# Patient Record
Sex: Female | Born: 1970 | Race: White | Hispanic: No | Marital: Married | Smoking: Never smoker
Health system: Southern US, Community
[De-identification: ages and names within clinical notes are randomized; demographics above are authoritative.]

## PROBLEM LIST (undated history)

## (undated) DIAGNOSIS — E785 Hyperlipidemia, unspecified: Secondary | ICD-10-CM

## (undated) DIAGNOSIS — R002 Palpitations: Secondary | ICD-10-CM

## (undated) DIAGNOSIS — K219 Gastro-esophageal reflux disease without esophagitis: Secondary | ICD-10-CM

## (undated) DIAGNOSIS — I10 Essential (primary) hypertension: Secondary | ICD-10-CM

## (undated) HISTORY — DX: Gastro-esophageal reflux disease without esophagitis: K21.9

## (undated) HISTORY — PX: TUBAL LIGATION: SHX77

## (undated) HISTORY — DX: Palpitations: R00.2

## (undated) HISTORY — DX: Essential (primary) hypertension: I10

## (undated) HISTORY — PX: OTHER SURGICAL HISTORY: SHX169

## (undated) HISTORY — DX: Hyperlipidemia, unspecified: E78.5

---

## 1998-07-11 ENCOUNTER — Inpatient Hospital Stay (HOSPITAL_COMMUNITY): Admission: AD | Admit: 1998-07-11 | Discharge: 1998-07-11 | Payer: Self-pay | Admitting: Obstetrics and Gynecology

## 1998-08-03 ENCOUNTER — Ambulatory Visit (HOSPITAL_COMMUNITY): Admission: RE | Admit: 1998-08-03 | Discharge: 1998-08-03 | Payer: Self-pay | Admitting: Obstetrics and Gynecology

## 1998-10-09 ENCOUNTER — Ambulatory Visit (HOSPITAL_COMMUNITY): Admission: RE | Admit: 1998-10-09 | Discharge: 1998-10-09 | Payer: Self-pay | Admitting: Obstetrics and Gynecology

## 1998-10-14 ENCOUNTER — Ambulatory Visit (HOSPITAL_COMMUNITY): Admission: RE | Admit: 1998-10-14 | Discharge: 1998-10-14 | Payer: Self-pay | Admitting: Obstetrics and Gynecology

## 1998-10-20 ENCOUNTER — Encounter: Admission: RE | Admit: 1998-10-20 | Discharge: 1999-01-18 | Payer: Self-pay | Admitting: Obstetrics and Gynecology

## 1998-12-10 ENCOUNTER — Encounter (HOSPITAL_COMMUNITY): Admission: RE | Admit: 1998-12-10 | Discharge: 1998-12-23 | Payer: Self-pay | Admitting: Obstetrics and Gynecology

## 1998-12-22 ENCOUNTER — Inpatient Hospital Stay (HOSPITAL_COMMUNITY): Admission: AD | Admit: 1998-12-22 | Discharge: 1998-12-25 | Payer: Self-pay | Admitting: Obstetrics and Gynecology

## 1999-01-02 ENCOUNTER — Inpatient Hospital Stay (HOSPITAL_COMMUNITY): Admission: AD | Admit: 1999-01-02 | Discharge: 1999-01-02 | Payer: Self-pay | Admitting: Obstetrics and Gynecology

## 1999-03-02 ENCOUNTER — Ambulatory Visit (HOSPITAL_COMMUNITY): Admission: RE | Admit: 1999-03-02 | Discharge: 1999-03-02 | Payer: Self-pay | Admitting: *Deleted

## 1999-06-14 ENCOUNTER — Ambulatory Visit (HOSPITAL_COMMUNITY): Admission: RE | Admit: 1999-06-14 | Discharge: 1999-06-14 | Payer: Self-pay | Admitting: Obstetrics and Gynecology

## 2000-09-06 ENCOUNTER — Other Ambulatory Visit: Admission: RE | Admit: 2000-09-06 | Discharge: 2000-09-06 | Payer: Self-pay | Admitting: Obstetrics and Gynecology

## 2001-05-11 ENCOUNTER — Other Ambulatory Visit: Admission: RE | Admit: 2001-05-11 | Discharge: 2001-05-11 | Payer: Self-pay | Admitting: *Deleted

## 2001-05-11 ENCOUNTER — Encounter (INDEPENDENT_AMBULATORY_CARE_PROVIDER_SITE_OTHER): Payer: Self-pay | Admitting: Specialist

## 2003-02-06 ENCOUNTER — Other Ambulatory Visit: Admission: RE | Admit: 2003-02-06 | Discharge: 2003-02-06 | Payer: Self-pay | Admitting: Obstetrics and Gynecology

## 2005-01-03 ENCOUNTER — Ambulatory Visit: Payer: Self-pay | Admitting: Family Medicine

## 2005-01-03 ENCOUNTER — Encounter: Admission: RE | Admit: 2005-01-03 | Discharge: 2005-01-03 | Payer: Self-pay | Admitting: Family Medicine

## 2005-01-04 ENCOUNTER — Ambulatory Visit: Payer: Self-pay | Admitting: Family Medicine

## 2005-03-25 ENCOUNTER — Ambulatory Visit: Payer: Self-pay | Admitting: Family Medicine

## 2005-04-05 ENCOUNTER — Encounter: Admission: RE | Admit: 2005-04-05 | Discharge: 2005-07-04 | Payer: Self-pay | Admitting: Family Medicine

## 2006-01-20 ENCOUNTER — Ambulatory Visit: Payer: Self-pay | Admitting: Family Medicine

## 2006-07-04 ENCOUNTER — Ambulatory Visit: Payer: Self-pay | Admitting: Family Medicine

## 2006-08-02 ENCOUNTER — Ambulatory Visit: Payer: Self-pay | Admitting: Family Medicine

## 2006-08-21 ENCOUNTER — Other Ambulatory Visit: Admission: RE | Admit: 2006-08-21 | Discharge: 2006-08-21 | Payer: Self-pay | Admitting: Obstetrics and Gynecology

## 2007-03-01 ENCOUNTER — Ambulatory Visit: Payer: Self-pay | Admitting: Family Medicine

## 2007-03-15 ENCOUNTER — Ambulatory Visit: Payer: Self-pay | Admitting: Family Medicine

## 2007-04-10 ENCOUNTER — Ambulatory Visit: Payer: Self-pay | Admitting: Family Medicine

## 2007-06-20 DIAGNOSIS — I10 Essential (primary) hypertension: Secondary | ICD-10-CM | POA: Insufficient documentation

## 2008-01-23 ENCOUNTER — Ambulatory Visit: Payer: Self-pay | Admitting: Family Medicine

## 2008-01-23 DIAGNOSIS — L723 Sebaceous cyst: Secondary | ICD-10-CM | POA: Insufficient documentation

## 2008-03-12 ENCOUNTER — Ambulatory Visit: Payer: Self-pay | Admitting: Family Medicine

## 2008-03-17 ENCOUNTER — Telehealth: Payer: Self-pay | Admitting: Family Medicine

## 2008-03-17 LAB — CONVERTED CEMR LAB
AST: 21 units/L (ref 0–37)
Alkaline Phosphatase: 45 units/L (ref 39–117)
Basophils Absolute: 0 10*3/uL (ref 0.0–0.1)
Chloride: 108 meq/L (ref 96–112)
Cholesterol: 200 mg/dL (ref 0–200)
Creatinine, Ser: 0.8 mg/dL (ref 0.4–1.2)
Eosinophils Absolute: 0.2 10*3/uL (ref 0.0–0.7)
GFR calc Af Amer: 106 mL/min
GFR calc non Af Amer: 87 mL/min
HCT: 40.5 % (ref 36.0–46.0)
HDL: 40.4 mg/dL (ref >39.0)
MCHC: 34.9 g/dL (ref 30.0–36.0)
MCV: 88 fL (ref 78.0–100.0)
Monocytes Absolute: 0.5 10*3/uL (ref 0.1–1.0)
Neutrophils Relative %: 60.3 % (ref 43.0–77.0)
Platelets: 243 10*3/uL (ref 150–400)
Potassium: 4.6 meq/L (ref 3.5–5.1)
TSH: 1.36 microintl units/mL (ref 0.35–5.50)
Total Bilirubin: 1.1 mg/dL (ref 0.3–1.2)
Triglycerides: 53 mg/dL (ref 0–149)
VLDL: 11 mg/dL (ref 0–40)

## 2008-04-21 ENCOUNTER — Telehealth: Payer: Self-pay | Admitting: Family Medicine

## 2008-04-21 ENCOUNTER — Ambulatory Visit: Payer: Self-pay | Admitting: Family Medicine

## 2008-04-21 DIAGNOSIS — R1013 Epigastric pain: Secondary | ICD-10-CM | POA: Insufficient documentation

## 2008-04-22 ENCOUNTER — Ambulatory Visit (HOSPITAL_COMMUNITY): Admission: RE | Admit: 2008-04-22 | Discharge: 2008-04-22 | Payer: Self-pay | Admitting: Family Medicine

## 2008-07-14 IMAGING — US US ABDOMEN COMPLETE
1 series · 14 of 25 positions shown · non-contrast
Comparison: CT 01/03/2005

CLINICAL DATA: Abdominal pain, nausea.

ABDOMEN ULTRASOUND
TECHNIQUE: Complete abdominal ultrasound examination was performed
including evaluation of the liver, gallbladder, bile ducts,
pancreas, kidneys, spleen, IVC, and abdominal aorta.

[Series 1: unknown · 0.33mm/px · 14 of 60 slices shown]
[im 1/60]
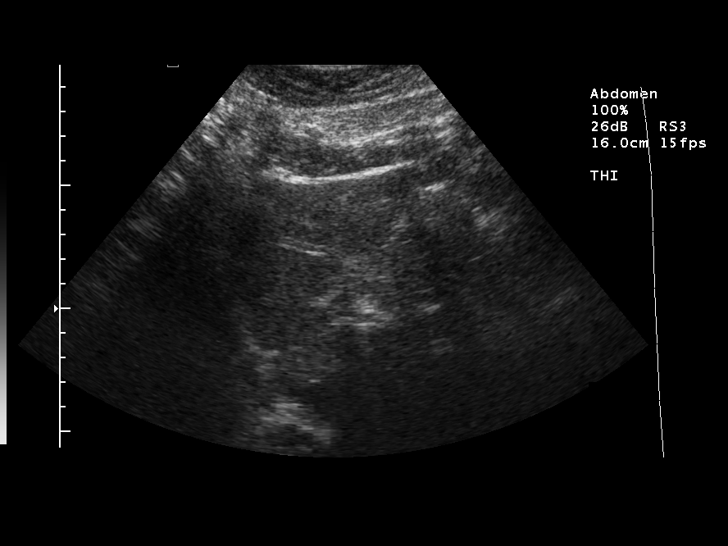
[im 5/60]
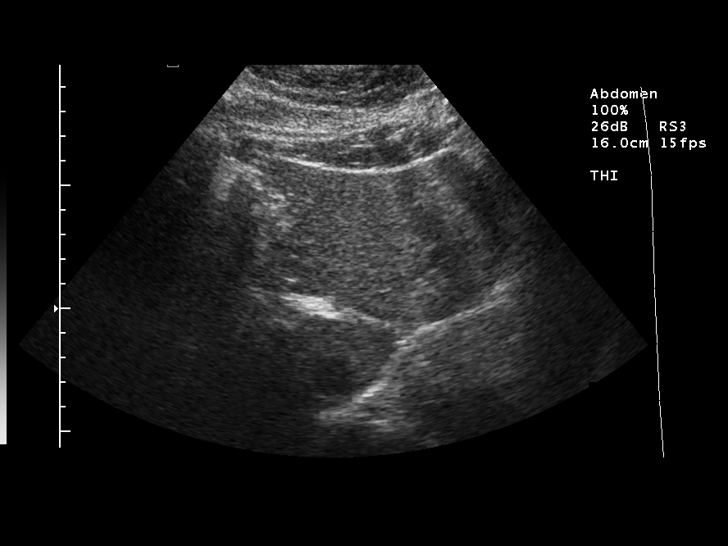
[im 10/60]
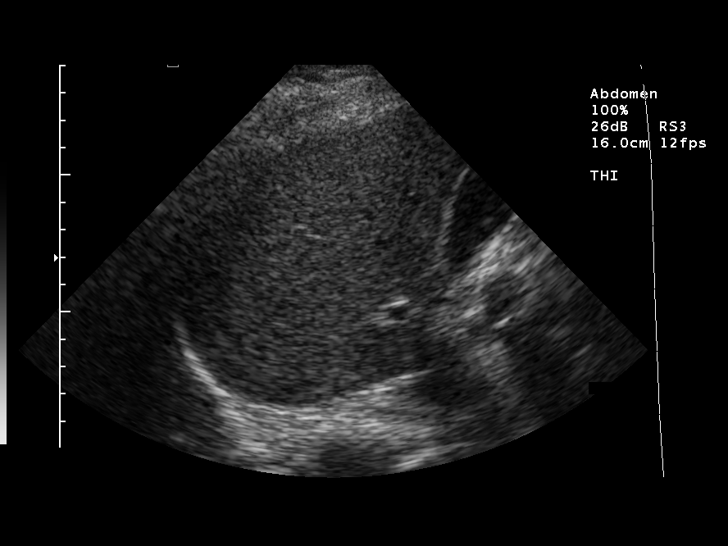
[im 15/60]
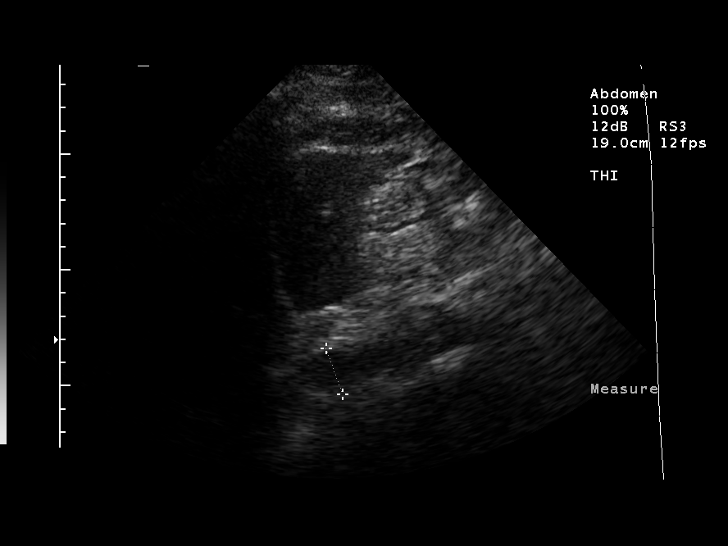
[im 20/60]
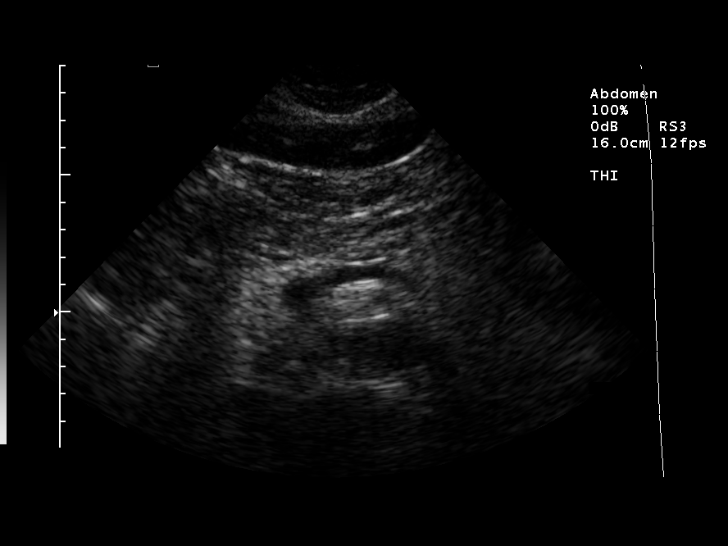
[im 23/60]
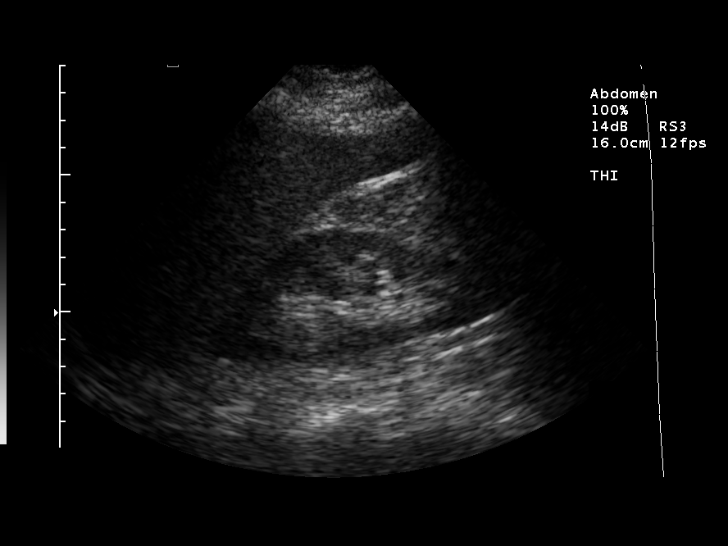
[im 28/60]
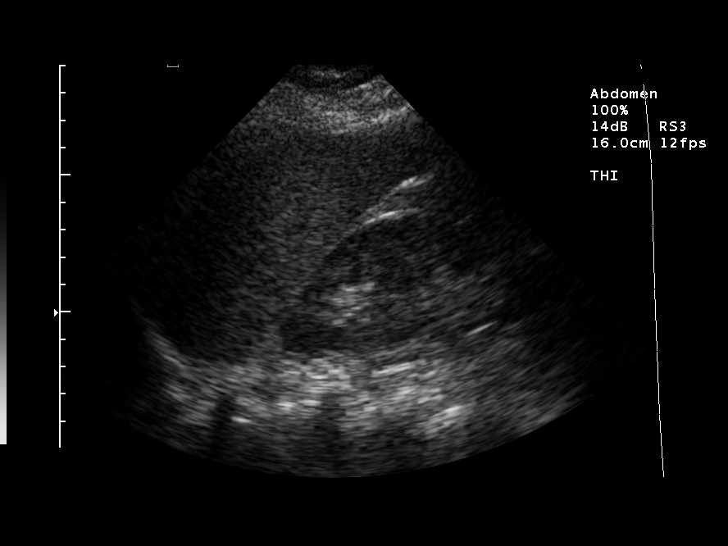
[im 32/60]
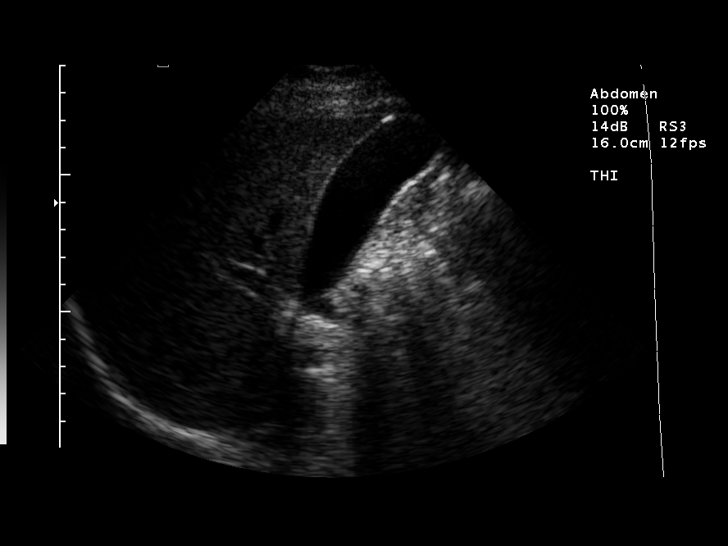
[im 37/60]
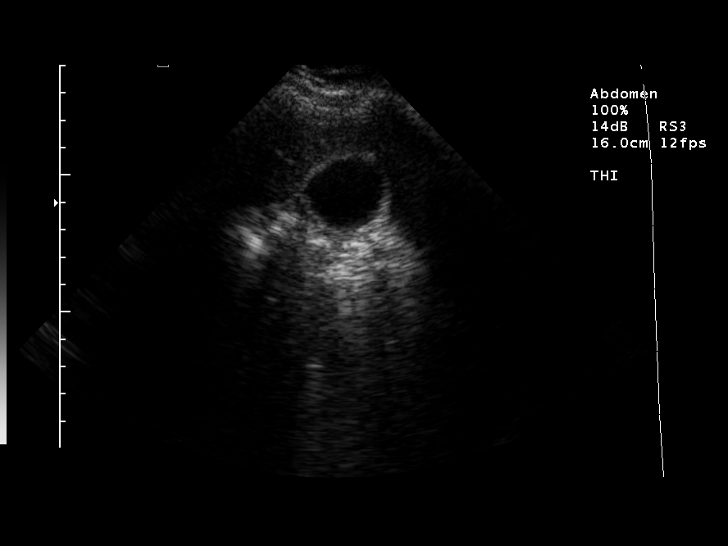
[im 40/60]
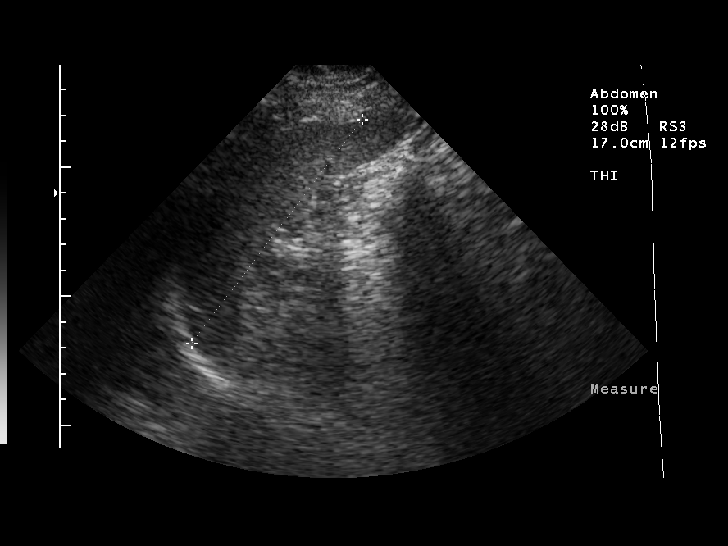
[im 45/60]
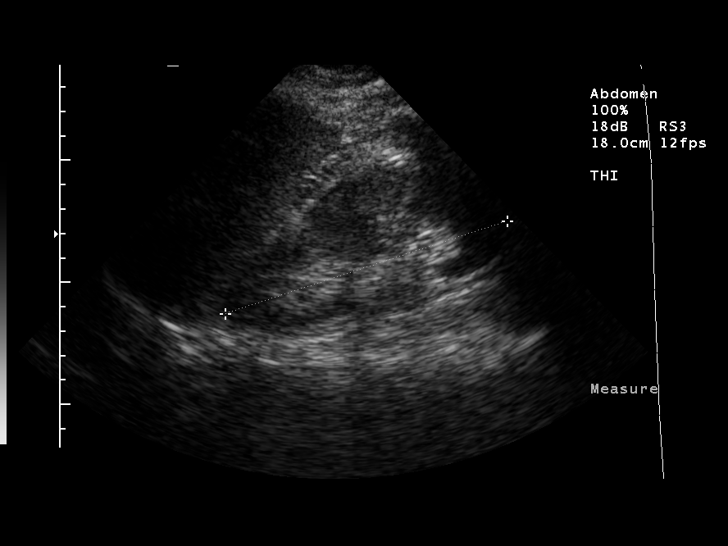
[im 50/60]
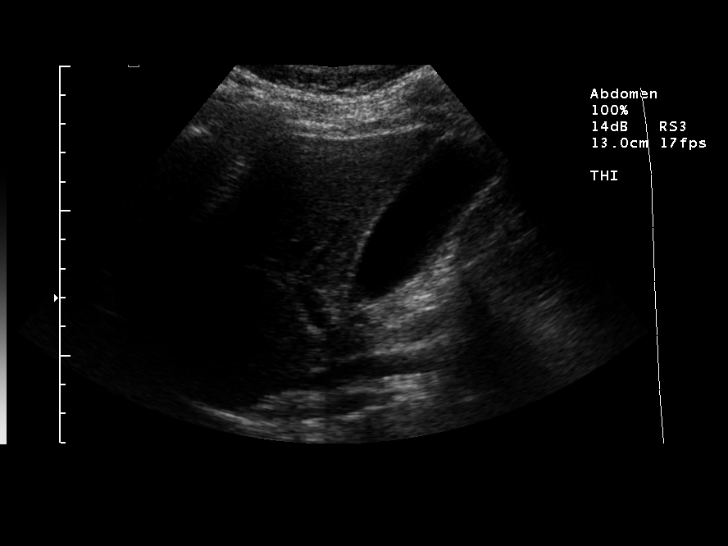
[im 55/60]
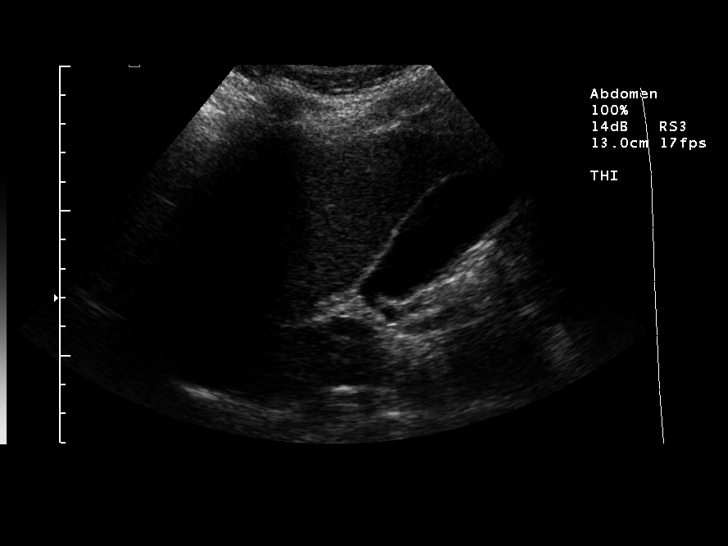
[im 60/60]
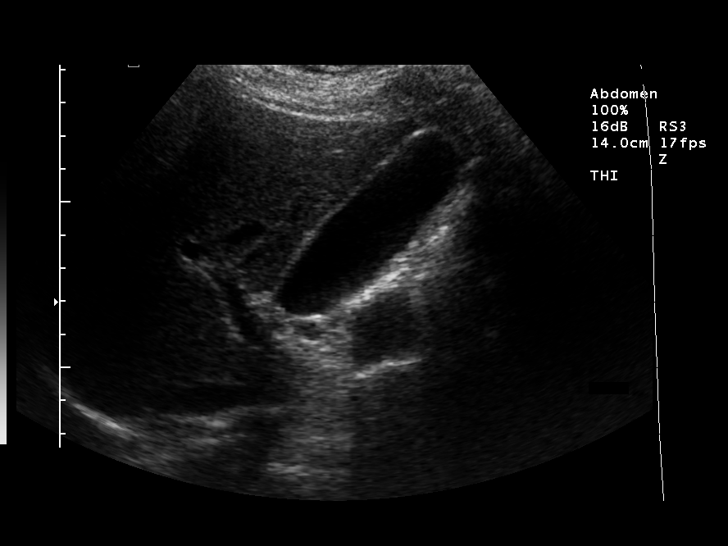

[14 of 25 positions shown; findings below may reference images not displayed]

FINDINGS: The liver is homogeneous in echotexture.  No focal
lesions are identified.  The gallbladder has a normal appearance
with wall thickness of 2.3 mm.  Common bile duct is normal in
caliber measuring 2.8 mm.

The visualized inferior vena cava, abdominal aorta, pancreas,
spleen, and kidneys have normal appearance.
IMPRESSION: Normal abdominal ultrasound.

## 2008-08-01 ENCOUNTER — Ambulatory Visit: Payer: Self-pay | Admitting: Family Medicine

## 2008-08-26 ENCOUNTER — Ambulatory Visit: Payer: Self-pay | Admitting: Gastroenterology

## 2008-08-26 DIAGNOSIS — R12 Heartburn: Secondary | ICD-10-CM | POA: Insufficient documentation

## 2008-08-26 DIAGNOSIS — K5900 Constipation, unspecified: Secondary | ICD-10-CM | POA: Insufficient documentation

## 2008-08-26 DIAGNOSIS — R110 Nausea: Secondary | ICD-10-CM | POA: Insufficient documentation

## 2008-08-26 DIAGNOSIS — R1011 Right upper quadrant pain: Secondary | ICD-10-CM | POA: Insufficient documentation

## 2008-08-26 LAB — CONVERTED CEMR LAB
Alkaline Phosphatase: 41 units/L (ref 39–117)
BUN: 12 mg/dL (ref 6–23)
Basophils Relative: 1 % (ref 0.0–3.0)
CO2: 32 meq/L (ref 19–32)
Creatinine, Ser: 0.7 mg/dL (ref 0.4–1.2)
GFR calc Af Amer: 123 mL/min
GFR calc non Af Amer: 102 mL/min
Glucose, Bld: 93 mg/dL (ref 70–99)
HCT: 39.7 % (ref 36.0–46.0)
Hemoglobin: 14.3 g/dL (ref 12.0–15.0)
Lipase: 21 units/L (ref 11.0–59.0)
Lymphocytes Relative: 25.2 % (ref 12.0–46.0)
Monocytes Absolute: 0.5 10*3/uL (ref 0.1–1.0)
Monocytes Relative: 7.9 % (ref 3.0–12.0)
Neutro Abs: 3.6 10*3/uL (ref 1.4–7.7)
RBC: 4.49 M/uL (ref 3.87–5.11)
RDW: 11.8 % (ref 11.5–14.6)
Sodium: 139 meq/L (ref 135–145)
Total Bilirubin: 1 mg/dL (ref 0.3–1.2)

## 2008-08-27 ENCOUNTER — Encounter: Payer: Self-pay | Admitting: Gastroenterology

## 2008-08-27 ENCOUNTER — Ambulatory Visit: Payer: Self-pay | Admitting: Gastroenterology

## 2008-08-29 ENCOUNTER — Encounter: Payer: Self-pay | Admitting: Gastroenterology

## 2008-09-02 ENCOUNTER — Ambulatory Visit: Payer: Self-pay | Admitting: Gastroenterology

## 2008-09-02 ENCOUNTER — Ambulatory Visit (HOSPITAL_COMMUNITY): Admission: RE | Admit: 2008-09-02 | Discharge: 2008-09-02 | Payer: Self-pay | Admitting: Gastroenterology

## 2008-09-03 LAB — CONVERTED CEMR LAB
BUN: 8 mg/dL (ref 6–23)
Chloride: 102 meq/L (ref 96–112)
GFR calc Af Amer: 123 mL/min
GFR calc non Af Amer: 102 mL/min
Potassium: 3.8 meq/L (ref 3.5–5.1)
Sodium: 138 meq/L (ref 135–145)

## 2009-01-16 ENCOUNTER — Emergency Department (HOSPITAL_COMMUNITY): Admission: EM | Admit: 2009-01-16 | Discharge: 2009-01-17 | Payer: Self-pay | Admitting: Emergency Medicine

## 2009-01-19 ENCOUNTER — Ambulatory Visit: Payer: Self-pay | Admitting: Family Medicine

## 2009-01-19 DIAGNOSIS — S93609A Unspecified sprain of unspecified foot, initial encounter: Secondary | ICD-10-CM | POA: Insufficient documentation

## 2009-01-19 DIAGNOSIS — S0003XA Contusion of scalp, initial encounter: Secondary | ICD-10-CM | POA: Insufficient documentation

## 2009-01-19 DIAGNOSIS — S0083XA Contusion of other part of head, initial encounter: Secondary | ICD-10-CM

## 2009-01-19 DIAGNOSIS — S7000XA Contusion of unspecified hip, initial encounter: Secondary | ICD-10-CM | POA: Insufficient documentation

## 2009-01-19 DIAGNOSIS — S1093XA Contusion of unspecified part of neck, initial encounter: Secondary | ICD-10-CM

## 2009-01-20 ENCOUNTER — Telehealth: Payer: Self-pay | Admitting: Family Medicine

## 2009-02-18 ENCOUNTER — Telehealth: Payer: Self-pay | Admitting: Family Medicine
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
## 2010-05-10 ENCOUNTER — Telehealth: Payer: Self-pay | Admitting: Family Medicine

## 2011-01-04 NOTE — Progress Notes (Signed)
Summary: REFILL REQUEST (LASIX)  Phone Note Refill Request Message from:  Fax from Pharmacy on May 10, 2010 9:50 AM  Refills Requested: Medication #1:  FUROSEMIDE 20 MG  TABS Take 1 tablet by mouth once a day   Notes: [facility] - [address] [place], [place], [place]  [address], Phone: [phone] / Fax: [phone].    Initial call taken by: Debbra Riding,  May 10, 2010 9:52 AM  Follow-up for Phone Call        Rx faxed to pharmacy Follow-up by: Raechel Ache, RN,  May 10, 2010 11:55 AM    Prescriptions: FUROSEMIDE 20 MG  TABS (FUROSEMIDE) Take 1 tablet by mouth once a day  #30 Tablet x 0   Entered by:   Raechel Ache, RN   Authorized by:   Nelwyn Salisbury MD   Signed by:   Raechel Ache, RN on 05/10/2010   Method used:   Historical   RxID:   [id number]

## 2011-12-06 HISTORY — PX: BREAST BIOPSY: SHX20

## 2012-10-23 ENCOUNTER — Encounter: Payer: Self-pay | Admitting: *Deleted

## 2013-02-12 ENCOUNTER — Encounter: Payer: Self-pay | Admitting: *Deleted

## 2019-11-12 ENCOUNTER — Other Ambulatory Visit: Payer: Self-pay | Admitting: Family Medicine

## 2019-11-12 DIAGNOSIS — Z1231 Encounter for screening mammogram for malignant neoplasm of breast: Secondary | ICD-10-CM

## 2020-01-01 ENCOUNTER — Other Ambulatory Visit: Payer: Self-pay

## 2020-01-01 ENCOUNTER — Ambulatory Visit
Admission: RE | Admit: 2020-01-01 | Discharge: 2020-01-01 | Disposition: A | Discharge status: Home / Self Care | Payer: Managed Care, Other (non HMO) | Source: Ambulatory Visit | Attending: Family Medicine | Admitting: Family Medicine

## 2020-01-01 DIAGNOSIS — Z1231 Encounter for screening mammogram for malignant neoplasm of breast: Secondary | ICD-10-CM

## 2020-04-06 ENCOUNTER — Other Ambulatory Visit: Payer: Managed Care, Other (non HMO)

## 2020-04-08 ENCOUNTER — Other Ambulatory Visit: Payer: Managed Care, Other (non HMO)

## 2020-04-08 ENCOUNTER — Ambulatory Visit: Payer: Managed Care, Other (non HMO) | Attending: Internal Medicine

## 2020-04-08 DIAGNOSIS — Z20822 Contact with and (suspected) exposure to covid-19: Secondary | ICD-10-CM

## 2020-04-09 LAB — NOVEL CORONAVIRUS, NAA: SARS-CoV-2, NAA: NOT DETECTED

## 2020-04-09 LAB — SARS-COV-2, NAA 2 DAY TAT

## 2020-04-11 ENCOUNTER — Other Ambulatory Visit (HOSPITAL_COMMUNITY): Payer: Managed Care, Other (non HMO)

## 2021-03-23 ENCOUNTER — Other Ambulatory Visit: Payer: Self-pay | Admitting: Neurological Surgery

## 2021-03-23 DIAGNOSIS — M4722 Other spondylosis with radiculopathy, cervical region: Secondary | ICD-10-CM

## 2021-03-28 ENCOUNTER — Ambulatory Visit
Admission: RE | Admit: 2021-03-28 | Discharge: 2021-03-28 | Disposition: A | Discharge status: Home / Self Care | Payer: Managed Care, Other (non HMO) | Source: Ambulatory Visit | Attending: Neurological Surgery | Admitting: Neurological Surgery

## 2021-03-28 ENCOUNTER — Other Ambulatory Visit: Payer: Self-pay

## 2021-03-28 DIAGNOSIS — M4722 Other spondylosis with radiculopathy, cervical region: Secondary | ICD-10-CM

## 2021-04-02 ENCOUNTER — Other Ambulatory Visit: Payer: Managed Care, Other (non HMO)

## 2021-07-02 ENCOUNTER — Other Ambulatory Visit: Payer: Self-pay | Admitting: Gastroenterology

## 2021-07-02 DIAGNOSIS — R1011 Right upper quadrant pain: Secondary | ICD-10-CM

## 2021-07-05 NOTE — Progress Notes (Signed)
Date:  07/06/2021   ID:  Samantha Lowe, DOB 02-Jul-1971, MRN [id number]  PCP:  [facility]  Cardiologist:  Rex Kras, DO, [facility] (established care 07/06/2021)  REASON FOR CONSULT: Chest pain  REQUESTING PHYSICIAN:  Pa, [address] [place],  [place] [address]  Chief Complaint  Patient presents with   Chest Pain   Palpitations   New Patient (Initial Visit)    HPI  Samantha Lowe is a 50 y.o. female who presents to the office with a chief complaint of " chest pain and palpitations." Patient's past medical history and cardiovascular risk factors include: Hypertension, hyperlipidemia, GERD, obesity due to excess calories.  She is referred to the office at the request of Samantha Lowe for evaluation of chest pain.  Palpitations: Patient states that she has been experiencing palpitations for the last several months, factors intermittently, lasting for few hours, self-limited.  No improving or worsening factors.  No episodes of near syncope or syncopal events.  The symptoms do wake her up at night at times.  Consumes 1 cup of caffeinated coffee and soda per day.  No use of illicit drugs, energy drinks, stimulants, weight loss supplements, or herbal supplements.  No prior history of anemia or thyroid disease.  The symptoms of palpitation have not increased in intensity, frequency, and/or duration.  Chest pain: Started happening early last month in July 2022 with effort related activities.  Patient has had at least 4 episodes when she has had substernal discomfort with effort related activities which have resolved shortly after resting.  The symptoms usually last less than a minute and not associated with radiation of pain, nausea, vomiting, or diaphoresis.  FUNCTIONAL STATUS: No structured exercise program or daily routine.   ALLERGIES: No Known Allergies  MEDICATION LIST PRIOR TO VISIT: Current Meds   Medication Sig   atorvastatin (LIPITOR) 20 MG tablet Take 20 mg by mouth daily.   cetirizine (ZYRTEC) 10 MG tablet Take 1 tablet by mouth daily at 12 noon.   esomeprazole (NEXIUM) 40 MG capsule Take 1 capsule by mouth daily at 12 noon.   hydrochlorothiazide (HYDRODIURIL) 25 MG tablet Take 25 mg by mouth daily.   methocarbamol (ROBAXIN) 500 MG tablet Take 500 mg by mouth as needed.   metoprolol tartrate (LOPRESSOR) 25 MG tablet Take 1 tablet (25 mg total) by mouth 2 (two) times daily for 15 days.   naproxen (EC NAPROSYN) 500 MG EC tablet Take 1 tablet by mouth as needed.   triamterene-hydrochlorothiazide (DYAZIDE) 37.5-25 MG per capsule Take 1 capsule by mouth every morning.   valsartan (DIOVAN) 160 MG tablet Take 160 mg by mouth daily.     PAST MEDICAL HISTORY: Past Medical History:  Diagnosis Date   GERD (gastroesophageal reflux disease)    Heart palpitations    Hyperlipidemia    Hypertension     PAST SURGICAL HISTORY: Past Surgical History:  Procedure Laterality Date   athroscopy right knee     BREAST BIOPSY  2013   Rt. negative.   ganglion cyst right wrist     TUBAL LIGATION      FAMILY HISTORY: The patient family history includes Diabetes in her father; Hyperlipidemia in her mother; Hypertension in her mother.  SOCIAL HISTORY:  The patient  reports that she has never smoked. She has never used smokeless tobacco. She reports that she does not drink alcohol and does not use drugs.  REVIEW OF SYSTEMS: Review of  Systems  Constitutional: Negative for chills and fever.  HENT:  Negative for hoarse voice and nosebleeds.   Eyes:  Negative for discharge, double vision and pain.  Cardiovascular:  Positive for chest pain and palpitations. Negative for claudication, dyspnea on exertion, leg swelling, near-syncope, orthopnea, paroxysmal nocturnal dyspnea and syncope.  Respiratory:  Negative for hemoptysis and shortness of breath.   Musculoskeletal:  Negative for muscle cramps and  myalgias.  Gastrointestinal:  Negative for abdominal pain, constipation, diarrhea, hematemesis, hematochezia, melena, nausea and vomiting.  Neurological:  Negative for dizziness and light-headedness.   PHYSICAL EXAM: Vitals with BMI 07/06/2021 01/19/2009 08/26/2008  Height 5' 5"  - 5' 5"   Weight 246 lbs 208 lbs 208 lbs 8 oz  BMI 09.98 - 33.8  Systolic 250 539 767  Diastolic 83 82 78  Pulse 83 87 76    CONSTITUTIONAL: Well-developed and well-nourished. No acute distress.  SKIN: Skin is warm and dry. No rash noted. No cyanosis. No pallor. No jaundice HEAD: Normocephalic and atraumatic.  EYES: No scleral icterus MOUTH/THROAT: Moist oral membranes.  NECK: No JVD present. No thyromegaly noted. No carotid bruits  LYMPHATIC: No visible cervical adenopathy.  CHEST Normal respiratory effort. No intercostal retractions  LUNGS: Clear to auscultation bilaterally.  No stridor. No wheezes. No rales.  CARDIOVASCULAR: Regular rate and rhythm, positive S1-S2, no murmurs rubs or gallops appreciated. ABDOMINAL: Obese, soft, nontender, nondistended, positive bowel sounds in all 4 quadrants, no apparent ascites.  EXTREMITIES: Trace bilateral peripheral edema, 2+ dorsalis pedis and posterior tibial pulses. HEMATOLOGIC: No significant bruising NEUROLOGIC: Oriented to person, place, and time. Nonfocal. Normal muscle tone.  PSYCHIATRIC: Normal mood and affect. Normal behavior. Cooperative  CARDIAC DATABASE: EKG: 07/06/2021: Normal sinus rhythm, 74 bpm, nonspecific T wave abnormality, without underlying injury pattern.  Echocardiogram: No results found for this or any previous visit from the past 1095 days.   Stress Testing: No results found for this or any previous visit from the past 1095 days.  Heart Catheterization: None  LABORATORY DATA: CBC Latest Ref Rng & Units 08/26/2008 03/12/2008  WBC 4.5 - 10.5 10*3/microliter 5.8 6.0  Hemoglobin 12.0 - 15.0 g/dL 14.3 14.1  Hematocrit 36.0 - 46.0 % 39.7 40.5   Platelets 150 - 400 K/uL 254 243    CMP Latest Ref Rng & Units 09/02/2008 08/26/2008 03/12/2008  Glucose 70 - 99 mg/dL 109(H) 93 85  BUN 6 - 23 mg/dL 8 12 10   Creatinine 0.4 - 1.2 mg/dL 0.7 0.7 0.8  Sodium 135 - 145 meq/L 138 139 142  Potassium 3.5 - 5.1 meq/L 3.8 3.2(L) 4.6  Chloride 96 - 112 meq/L 102 103 108  CO2 19 - 32 meq/L 28 32 29  Calcium 8.4 - 10.5 mg/dL 9.4 9.2 9.6  Total Protein 6.0 - 8.3 g/dL - 7.6 7.4  Total Bilirubin 0.3 - 1.2 mg/dL - 1.0 1.1  Alkaline Phos 39 - 117 units/L - 41 45  AST 0 - 37 units/L - 19 21  ALT 0 - 35 units/L - 21 20    Lipid Panel     Component Value Date/Time   CHOL 200 03/12/2008 1113   TRIG 53 03/12/2008 1113   HDL 40.4 03/12/2008 1113   CHOLHDL 5.0 CALC 03/12/2008 1113   VLDL 11 03/12/2008 1113   LDLCALC 149 (H) 03/12/2008 1113    No components found for: NTPROBNP No results for input(s): PROBNP in the last 8760 hours. No results for input(s): TSH in the last 8760 hours.  BMP No results  for input(s): NA, K, CL, CO2, GLUCOSE, BUN, CREATININE, CALCIUM, GFRNONAA, GFRAA in the last 8760 hours.  HEMOGLOBIN A1C No results found for: HGBA1C, MPG  External Labs: Collected: 06/04/2021 Hemoglobin 12.6 g/dL, hematocrit 38.3% Creatinine 0.74 mg/dL. eGFR: 100 mL/min per 1.73 m Sodium 140, potassium 3.7, chloride 102, bicarb 30, AST 30, alkaline phosphatase 53. ALT 54 (elevated) Lipid profile: Total cholesterol 183, triglycerides 77, HDL 63, LDL 106, non-HDL 120.   IMPRESSION:    ICD-10-CM   1. Precordial pain  R07.2 EKG 12-Lead    metoprolol tartrate (LOPRESSOR) 25 MG tablet    PCV ECHOCARDIOGRAM COMPLETE    Basic metabolic panel    2. Palpitations  R00.2     3. Benign hypertension  I10     4. Mixed hyperlipidemia  E78.2     5. Class 3 severe obesity due to excess calories with serious comorbidity and body mass index (BMI) of 40.0 to 44.9 in adult (HCC)  E66.01    Z68.41     6. Chest pain, unspecified type  R07.9 CT CORONARY  MORPH W/CTA COR W/SCORE W/CA W/CM &/OR WO/CM       RECOMMENDATIONS: DELORESE SELLIN is a 50 y.o. female whose past medical history and cardiac risk factors include: Hypertension, hyperlipidemia, GERD, obesity due to excess calories.  Precordial pain: Symptoms suggestive of angina pectoris. Shared decision was to proceed with ischemic evaluation.  We discussed the risks, benefits, and limitations of both coronary CTA versus stress testing and patient would like to proceed with coronary CTA. Check BMP Start Lopressor 25 mg p.o. twice daily a week prior to coronary CTA. Echocardiogram will be ordered to evaluate for structural heart disease and left ventricular systolic function.  Palpitations: Monitor for now. Patient is asked to seek medical attention by going to the closest ER via EMS if she has worsening palpitations or experiences near syncope or syncopal event. Will consider Holter monitor after the ischemic work-up.  Benign essential hypertension Office blood pressures are currently at goal. Medications reconciled. Recommended the importance of a low-salt diet. Currently managed by primary care provider.  Hyperlipidemia: Continue statin therapy. Medications reconciled. Does not endorse myalgias. Currently managed by primary care provider.   FINAL MEDICATION LIST END OF ENCOUNTER: Meds ordered this encounter  Medications   metoprolol tartrate (LOPRESSOR) 25 MG tablet    Sig: Take 1 tablet (25 mg total) by mouth 2 (two) times daily for 15 days.    Dispense:  30 tablet    Refill:  0     Medications Discontinued During This Encounter  Medication Reason   furosemide (LASIX) 20 MG tablet Error   nebivolol (BYSTOLIC) 5 MG tablet Error     Current Outpatient Medications:    atorvastatin (LIPITOR) 20 MG tablet, Take 20 mg by mouth daily., Disp: , Rfl:    cetirizine (ZYRTEC) 10 MG tablet, Take 1 tablet by mouth daily at 12 noon., Disp: , Rfl:    esomeprazole (NEXIUM) 40  MG capsule, Take 1 capsule by mouth daily at 12 noon., Disp: , Rfl:    hydrochlorothiazide (HYDRODIURIL) 25 MG tablet, Take 25 mg by mouth daily., Disp: , Rfl:    methocarbamol (ROBAXIN) 500 MG tablet, Take 500 mg by mouth as needed., Disp: , Rfl:    metoprolol tartrate (LOPRESSOR) 25 MG tablet, Take 1 tablet (25 mg total) by mouth 2 (two) times daily for 15 days., Disp: 30 tablet, Rfl: 0   naproxen (EC NAPROSYN) 500 MG EC tablet, Take 1 tablet  by mouth as needed., Disp: , Rfl:    triamterene-hydrochlorothiazide (DYAZIDE) 37.5-25 MG per capsule, Take 1 capsule by mouth every morning., Disp: , Rfl:    valsartan (DIOVAN) 160 MG tablet, Take 160 mg by mouth daily., Disp: , Rfl:   Orders Placed This Encounter  Procedures   CT CORONARY MORPH W/CTA COR W/SCORE W/CA W/CM &/OR WO/CM   Basic metabolic panel   EKG [id number]   PCV ECHOCARDIOGRAM COMPLETE    There are no Patient Instructions on file for this visit.   --Continue cardiac medications as reconciled in final medication list. --Return in about 4 weeks (around 08/03/2021) for Follow up, Chest pain, Palpitations, Review test results. Or sooner if needed. --Continue follow-up with your primary care physician regarding the management of your other chronic comorbid conditions.  Patient's questions and concerns were addressed to her satisfaction. She voices understanding of the instructions provided during this encounter.   This note was created using a voice recognition software as a result there may be grammatical errors inadvertently enclosed that do not reflect the nature of this encounter. Every attempt is made to correct such errors.  Rex Kras, [place], [facility]  Pager: [phone] Office: [phone]

## 2021-07-06 ENCOUNTER — Ambulatory Visit: Payer: Managed Care, Other (non HMO) | Admitting: Cardiology

## 2021-07-06 ENCOUNTER — Encounter: Payer: Self-pay | Admitting: Cardiology

## 2021-07-06 ENCOUNTER — Other Ambulatory Visit: Payer: Self-pay

## 2021-07-06 VITALS — BP 118/83 | HR 83 | Temp 98.6°F | Resp 16 | Ht 65.0 in | Wt 246.0 lb

## 2021-07-06 DIAGNOSIS — R072 Precordial pain: Secondary | ICD-10-CM

## 2021-07-06 DIAGNOSIS — I10 Essential (primary) hypertension: Secondary | ICD-10-CM

## 2021-07-06 DIAGNOSIS — R002 Palpitations: Secondary | ICD-10-CM

## 2021-07-06 DIAGNOSIS — E782 Mixed hyperlipidemia: Secondary | ICD-10-CM

## 2021-07-06 DIAGNOSIS — Z6841 Body Mass Index (BMI) 40.0 and over, adult: Secondary | ICD-10-CM

## 2021-07-06 DIAGNOSIS — R079 Chest pain, unspecified: Secondary | ICD-10-CM

## 2021-07-06 MED ORDER — METOPROLOL TARTRATE 25 MG PO TABS: 25.0000 mg | ORAL_TABLET | Freq: Two times a day (BID) | ORAL | 0 refills | Status: DC

## 2021-07-08 ENCOUNTER — Ambulatory Visit: Payer: Managed Care, Other (non HMO)

## 2021-07-08 ENCOUNTER — Other Ambulatory Visit: Payer: Self-pay

## 2021-07-08 DIAGNOSIS — R072 Precordial pain: Secondary | ICD-10-CM

## 2021-07-13 ENCOUNTER — Other Ambulatory Visit: Payer: Self-pay | Admitting: Home Modifications

## 2021-07-13 DIAGNOSIS — R922 Inconclusive mammogram: Secondary | ICD-10-CM

## 2021-07-13 DIAGNOSIS — Z1231 Encounter for screening mammogram for malignant neoplasm of breast: Secondary | ICD-10-CM

## 2021-07-16 ENCOUNTER — Encounter (HOSPITAL_COMMUNITY): Payer: Self-pay

## 2021-07-16 ENCOUNTER — Telehealth (HOSPITAL_COMMUNITY): Payer: Self-pay | Admitting: Emergency Medicine

## 2021-07-16 ENCOUNTER — Ambulatory Visit
Admission: RE | Admit: 2021-07-16 | Discharge: 2021-07-16 | Disposition: A | Discharge status: Home / Self Care | Payer: Managed Care, Other (non HMO) | Source: Ambulatory Visit | Attending: Gastroenterology | Admitting: Gastroenterology

## 2021-07-16 DIAGNOSIS — R1011 Right upper quadrant pain: Secondary | ICD-10-CM

## 2021-07-16 LAB — BASIC METABOLIC PANEL
BUN/Creatinine Ratio: 15 (ref 9–23)
BUN: 12 mg/dL (ref 6–24)
CO2: 27 mmol/L (ref 20–29)
Calcium: 10 mg/dL (ref 8.7–10.2)
Chloride: 99 mmol/L (ref 96–106)
Creatinine, Ser: 0.78 mg/dL (ref 0.57–1.00)
Glucose: 88 mg/dL (ref 65–99)
Potassium: 4 mmol/L (ref 3.5–5.2)
Sodium: 141 mmol/L (ref 134–144)
eGFR: 94 mL/min/{1.73_m2} (ref >59)

## 2021-07-16 NOTE — Telephone Encounter (Signed)
Reaching out to patient to offer assistance regarding upcoming cardiac imaging study; pt verbalizes understanding of appt date/time, parking situation and where to check in, pre-test NPO status and medications ordered, and verified current allergies; name and call back number provided for further questions should they arise Odarius Dines RN Navigator Cardiac Imaging [facility] [phone] office [phone] cell 

## 2021-07-20 ENCOUNTER — Ambulatory Visit (HOSPITAL_COMMUNITY)
Admission: RE | Admit: 2021-07-20 | Discharge: 2021-07-20 | Disposition: A | Discharge status: Home / Self Care | Payer: Managed Care, Other (non HMO) | Source: Ambulatory Visit | Attending: Internal Medicine | Admitting: Internal Medicine

## 2021-07-20 ENCOUNTER — Other Ambulatory Visit: Payer: Self-pay

## 2021-07-20 DIAGNOSIS — R079 Chest pain, unspecified: Secondary | ICD-10-CM | POA: Diagnosis present

## 2021-07-20 MED ORDER — IOHEXOL 350 MG/ML SOLN
100.0000 mL | Freq: Once | INTRAVENOUS | Status: AC | PRN
  Administered 2021-07-20: 100 mL via INTRAVENOUS

## 2021-07-20 MED ORDER — METOPROLOL TARTRATE 5 MG/5ML IV SOLN
INTRAVENOUS | Status: AC
  Filled 2021-07-20: qty 5

## 2021-07-20 MED ORDER — NITROGLYCERIN 0.4 MG SL SUBL
SUBLINGUAL_TABLET | SUBLINGUAL | Status: AC
  Administered 2021-07-20: 0.8 mg
  Filled 2021-07-20: qty 2

## 2021-07-20 MED ORDER — NITROGLYCERIN 0.4 MG SL SUBL: 0.8000 mg | SUBLINGUAL_TABLET | Freq: Once | SUBLINGUAL | Status: AC

## 2021-07-21 DIAGNOSIS — R079 Chest pain, unspecified: Secondary | ICD-10-CM | POA: Insufficient documentation

## 2021-07-24 ENCOUNTER — Other Ambulatory Visit: Payer: Self-pay | Admitting: Cardiology

## 2021-07-24 DIAGNOSIS — R072 Precordial pain: Secondary | ICD-10-CM

## 2021-08-03 ENCOUNTER — Encounter: Payer: Self-pay | Admitting: Cardiology

## 2021-08-03 ENCOUNTER — Other Ambulatory Visit: Payer: Self-pay

## 2021-08-03 ENCOUNTER — Ambulatory Visit: Payer: Managed Care, Other (non HMO) | Admitting: Cardiology

## 2021-08-03 VITALS — BP 119/80 | HR 86 | Temp 97.0°F | Resp 16 | Ht 65.0 in | Wt 244.6 lb

## 2021-08-03 DIAGNOSIS — R002 Palpitations: Secondary | ICD-10-CM

## 2021-08-03 DIAGNOSIS — E782 Mixed hyperlipidemia: Secondary | ICD-10-CM

## 2021-08-03 DIAGNOSIS — Z6841 Body Mass Index (BMI) 40.0 and over, adult: Secondary | ICD-10-CM

## 2021-08-03 DIAGNOSIS — I10 Essential (primary) hypertension: Secondary | ICD-10-CM

## 2021-08-03 DIAGNOSIS — R079 Chest pain, unspecified: Secondary | ICD-10-CM

## 2021-08-03 DIAGNOSIS — R072 Precordial pain: Secondary | ICD-10-CM

## 2021-08-03 NOTE — Progress Notes (Signed)
Date:  08/03/2021   ID:  Samantha Lowe, DOB 10-01-1971, MRN [id number]  PCP:  [facility]  Cardiologist:  Rex Kras, DO, [facility] (established care 07/06/2021)  REASON FOR CONSULT: Chest pain  REQUESTING PHYSICIAN:  Pa, [address] [place],  [place] [address]  Chief Complaint  Patient presents with   Chest Pain   Palpitations   Results   Follow-up    HPI  FRANCILLE WITTMANN is a 50 y.o. female who presents to the office with a chief complaint of " chest pain and palpitations." Patient's past medical history and cardiovascular risk factors include: Hypertension, hyperlipidemia, GERD, obesity due to excess calories.  She is referred to the office at the request of Eilene Ghazi for evaluation of chest pain.  Patient is accompanied by her husband at today's visit.  During initial consultation patient was concerned about chest discomfort and given her risk factors that shared decision was to proceed with coronary CTA.  Results reviewed with both the patient and her husband at today's visit.  Her total coronary calcium score is 0 and she has very minimal CAD involving the RCA distribution.  Since last office visit patient states that her chest pain is completely resolved.  With regards to palpitations the symptoms may still be present intermittently but the intensity, frequency, and/or duration has not worsened.  Patient has not had any episodes of near syncope or syncopal event.  FUNCTIONAL STATUS: No structured exercise program or daily routine.   ALLERGIES: No Known Allergies  MEDICATION LIST PRIOR TO VISIT: Current Meds  Medication Sig   atorvastatin (LIPITOR) 20 MG tablet Take 20 mg by mouth daily.   cetirizine (ZYRTEC) 10 MG tablet Take 1 tablet by mouth daily at 12 noon.   esomeprazole (NEXIUM) 40 MG capsule Take 1 capsule by mouth daily at 12 noon.   hydrochlorothiazide (HYDRODIURIL) 25 MG  tablet Take 25 mg by mouth daily.   methocarbamol (ROBAXIN) 500 MG tablet Take 500 mg by mouth as needed.   naproxen (EC NAPROSYN) 500 MG EC tablet Take 1 tablet by mouth as needed.   valsartan (DIOVAN) 160 MG tablet Take 160 mg by mouth daily.     PAST MEDICAL HISTORY: Past Medical History:  Diagnosis Date   GERD (gastroesophageal reflux disease)    Heart palpitations    Hyperlipidemia    Hypertension     PAST SURGICAL HISTORY: Past Surgical History:  Procedure Laterality Date   athroscopy right knee     BREAST BIOPSY  2013   Rt. negative.   ganglion cyst right wrist     TUBAL LIGATION      FAMILY HISTORY: The patient family history includes Diabetes in her father; Hyperlipidemia in her mother; Hypertension in her mother.  SOCIAL HISTORY:  The patient  reports that she has never smoked. She has never used smokeless tobacco. She reports that she does not drink alcohol and does not use drugs.  REVIEW OF SYSTEMS: Review of Systems  Constitutional: Negative for chills and fever.  HENT:  Negative for hoarse voice and nosebleeds.   Eyes:  Negative for discharge, double vision and pain.  Cardiovascular:  Positive for palpitations. Negative for chest pain, claudication, dyspnea on exertion, leg swelling, near-syncope, orthopnea, paroxysmal nocturnal dyspnea and syncope.  Respiratory:  Negative for hemoptysis and shortness of breath.   Musculoskeletal:  Negative for muscle cramps and myalgias.  Gastrointestinal:  Negative for abdominal pain, constipation, diarrhea,  hematemesis, hematochezia, melena, nausea and vomiting.  Neurological:  Negative for dizziness and light-headedness.   PHYSICAL EXAM: Vitals with BMI 08/03/2021 07/20/2021 07/20/2021  Height _0  - -  Weight 244 lbs 10 oz - -  BMI 28.7 - -  Systolic 681 157 262  Diastolic 80 82 72  Pulse 86 66 59    CONSTITUTIONAL: Well-developed and well-nourished. No acute distress.  SKIN: Skin is warm and dry. No rash noted.  No cyanosis. No pallor. No jaundice HEAD: Normocephalic and atraumatic.  EYES: No scleral icterus MOUTH/THROAT: Moist oral membranes.  NECK: No JVD present. No thyromegaly noted. No carotid bruits  LYMPHATIC: No visible cervical adenopathy.  CHEST Normal respiratory effort. No intercostal retractions  LUNGS: Clear to auscultation bilaterally.  No stridor. No wheezes. No rales.  CARDIOVASCULAR: Regular rate and rhythm, positive S1-S2, no murmurs rubs or gallops appreciated. ABDOMINAL: Obese, soft, nontender, nondistended, positive bowel sounds in all 4 quadrants, no apparent ascites.  EXTREMITIES: Trace bilateral peripheral edema, 2+ dorsalis pedis and posterior tibial pulses. HEMATOLOGIC: No significant bruising NEUROLOGIC: Oriented to person, place, and time. Nonfocal. Normal muscle tone.  PSYCHIATRIC: Normal mood and affect. Normal behavior. Cooperative  CARDIAC DATABASE: EKG: 07/06/2021: Normal sinus rhythm, 74 bpm, nonspecific T wave abnormality, without underlying injury pattern.  Echocardiogram: 07/08/2021: Normal LV systolic function with visual EF 60-65%. Left ventricle cavity is normal in size. Normal left ventricular wall thickness. Normal global wall motion. Normal diastolic filling pattern, normal LAP. No significant valvular heart disease. No prior study for comparison.   Stress Testing: No results found for this or any previous visit from the past 1095 days.  Coronary CTA: 07/20/2021: IMPRESSION: 1. Total coronary calcium score of 0 AU. 2. Normal coronary origin with right dominance. 3. CAD-RADS = 1 Minimal non-obstructive CAD (0-24%). Minimal noncalcified plaque (0-24%) within the proximal/mid RCA. Otherwise, left main, LAD, and LCx are patent without epicardial coronary artery disease. Noncardiac findings: IMPRESSION: 1. No acute findings in the imaged extracardiac chest. 2. Tiny hiatal hernia. 3. Mild hepatic steatosis. RECOMMENDATIONS: Consider  non-atherosclerotic causes of chest pain. Consider preventive therapy and risk factor modification.  Heart Catheterization: None  LABORATORY DATA: CBC Latest Ref Rng & Units 08/26/2008 03/12/2008  WBC 4.5 - 10.5 10*3/microliter 5.8 6.0  Hemoglobin 12.0 - 15.0 g/dL 14.3 14.1  Hematocrit 36.0 - 46.0 % 39.7 40.5  Platelets 150 - 400 K/uL 254 243    CMP Latest Ref Rng & Units 07/15/2021 09/02/2008 08/26/2008  Glucose 65 - 99 mg/dL 88 109(H) 93  BUN 6 - 24 mg/dL _1 Creatinine 0.57 - 1.00 mg/dL 0.78 0.7 0.7  Sodium 134 - 144 mmol/L 141 138 139  Potassium 3.5 - 5.2 mmol/L 4.0 3.8 3.2(L)  Chloride 96 - 106 mmol/L 99 102 103  CO2 20 - 29 mmol/L 27 28 32  Calcium 8.7 - 10.2 mg/dL 10.0 9.4 9.2  Total Protein 6.0 - 8.3 g/dL - - 7.6  Total Bilirubin 0.3 - 1.2 mg/dL - - 1.0  Alkaline Phos 39 - 117 units/L - - 41  AST 0 - 37 units/L - - 19  ALT 0 - 35 units/L - - 21    Lipid Panel     Component Value Date/Time   CHOL 200 03/12/2008 1113   TRIG 53 03/12/2008 1113   HDL 40.4 03/12/2008 1113   CHOLHDL 5.0 CALC 03/12/2008 1113   VLDL 11 03/12/2008 1113   LDLCALC 149 (H) 03/12/2008 1113    No components found  for: NTPROBNP No results for input(s): PROBNP in the last 8760 hours. No results for input(s): TSH in the last 8760 hours.  BMP Recent Labs    07/15/21 1419  NA 141  K 4.0  CL 99  CO2 27  GLUCOSE 88  BUN 12  CREATININE 0.78  CALCIUM 10.0    HEMOGLOBIN A1C No results found for: HGBA1C, MPG  External Labs: Collected: 06/04/2021 Hemoglobin 12.6 g/dL, hematocrit 38.3% Creatinine 0.74 mg/dL. eGFR: 100 mL/min per 1.73 m Sodium 140, potassium 3.7, chloride 102, bicarb 30, AST 30, alkaline phosphatase 53. ALT 54 (elevated) Lipid profile: Total cholesterol 183, triglycerides 77, HDL 63, LDL 106, non-HDL 120.   IMPRESSION:    ICD-10-CM   1. Precordial pain  R07.2     2. Palpitations  R00.2     3. Benign hypertension  I10     4. Mixed hyperlipidemia  E78.2      5. Class 3 severe obesity due to excess calories with serious comorbidity and body mass index (BMI) of 40.0 to 44.9 in adult [facility])  E66.01    Z68.41        RECOMMENDATIONS: DELYLA SANDEEN is a 50 y.o. female whose past medical history and cardiac risk factors include: Hypertension, hyperlipidemia, GERD, obesity due to excess calories.  Precordial pain Chest pain-free. Coronary CTA results including the images reviewed in detail with both the patient and her husband at today's visit. Total coronary calcium score 0.  Very minimal noncalcified plaque in the proximal RCA.  Educated on improving her cardiovascular risk factors including cholesterol, eating healthier, low-salt diet, increasing physical activity to 30 minutes a day 5 days a week.  Palpitations Palpitations are overall stable. The shared decision was to hold off on proceeding with a Holter monitor at this time. Educated on bearing down or pushing against the wall if the symptoms of palpitation lasted for prolonged period of time. She is more than welcome to call the office to set up a monitor if her symptoms were to get worse in intensity, frequency or duration. Patient is educated on seeking medical attention by going to the closest ER via EMS if she were to have a syncopal event.  Benign hypertension Office blood pressures are very well controlled. Medications reconciled. Low-salt diet recommended Increase physical activity with a goal of 30 minutes a day 5 days a week.  Mixed hyperlipidemia Currently on atorvastatin 20 mg p.o. nightly. Currently managed by primary care provider.  Class 3 severe obesity due to excess calories with serious comorbidity and body mass index (BMI) of 40.0 to 44.9 in adult [facility]) Body mass index is 40.7 kg/m. I reviewed with the patient the importance of diet, regular physical activity/exercise, weight loss.   Patient is educated on increasing physical activity gradually as tolerated.  With  the goal of moderate intensity exercise for 30 minutes a day 5 days a week.   FINAL MEDICATION LIST END OF ENCOUNTER: No orders of the defined types were placed in this encounter.    Medications Discontinued During This Encounter  Medication Reason   triamterene-hydrochlorothiazide (DYAZIDE) 37.5-25 MG per capsule Error   metoprolol tartrate (LOPRESSOR) 25 MG tablet Error     Current Outpatient Medications:    atorvastatin (LIPITOR) 20 MG tablet, Take 20 mg by mouth daily., Disp: , Rfl:    cetirizine (ZYRTEC) 10 MG tablet, Take 1 tablet by mouth daily at 12 noon., Disp: , Rfl:    esomeprazole (NEXIUM) 40 MG capsule, Take 1 capsule  by mouth daily at 12 noon., Disp: , Rfl:    hydrochlorothiazide (HYDRODIURIL) 25 MG tablet, Take 25 mg by mouth daily., Disp: , Rfl:    methocarbamol (ROBAXIN) 500 MG tablet, Take 500 mg by mouth as needed., Disp: , Rfl:    naproxen (EC NAPROSYN) 500 MG EC tablet, Take 1 tablet by mouth as needed., Disp: , Rfl:    valsartan (DIOVAN) 160 MG tablet, Take 160 mg by mouth daily., Disp: , Rfl:   No orders of the defined types were placed in this encounter.   There are no Patient Instructions on file for this visit.   --Continue cardiac medications as reconciled in final medication list. --Return in about 1 year (around 08/03/2022) for Follow up minimal CAD, palpitations.. Or sooner if needed. --Continue follow-up with your primary care physician regarding the management of your other chronic comorbid conditions.  Patient's questions and concerns were addressed to her satisfaction. She voices understanding of the instructions provided during this encounter.   This note was created using a voice recognition software as a result there may be grammatical errors inadvertently enclosed that do not reflect the nature of this encounter. Every attempt is made to correct such errors.  Rex Kras, [place], [facility]  Pager: [phone] Office: [phone]

## 2021-08-12 ENCOUNTER — Other Ambulatory Visit: Payer: Self-pay

## 2021-08-12 ENCOUNTER — Ambulatory Visit: Payer: Managed Care, Other (non HMO)

## 2021-08-12 ENCOUNTER — Ambulatory Visit
Admission: RE | Admit: 2021-08-12 | Discharge: 2021-08-12 | Disposition: A | Discharge status: Home / Self Care | Payer: Managed Care, Other (non HMO) | Source: Ambulatory Visit | Attending: Home Modifications | Admitting: Home Modifications

## 2021-08-12 DIAGNOSIS — R922 Inconclusive mammogram: Secondary | ICD-10-CM

## 2022-07-20 ENCOUNTER — Emergency Department (HOSPITAL_BASED_OUTPATIENT_CLINIC_OR_DEPARTMENT_OTHER)
Admission: EM | Admit: 2022-07-20 | Discharge: 2022-07-20 | Disposition: A | Discharge status: Home / Self Care | Payer: Managed Care, Other (non HMO) | Attending: Emergency Medicine | Admitting: Emergency Medicine

## 2022-07-20 ENCOUNTER — Encounter (HOSPITAL_BASED_OUTPATIENT_CLINIC_OR_DEPARTMENT_OTHER): Payer: Self-pay | Admitting: Emergency Medicine

## 2022-07-20 ENCOUNTER — Other Ambulatory Visit: Payer: Self-pay

## 2022-07-20 DIAGNOSIS — M436 Torticollis: Secondary | ICD-10-CM | POA: Diagnosis not present

## 2022-07-20 DIAGNOSIS — I10 Essential (primary) hypertension: Secondary | ICD-10-CM | POA: Diagnosis not present

## 2022-07-20 DIAGNOSIS — M62830 Muscle spasm of back: Secondary | ICD-10-CM | POA: Diagnosis not present

## 2022-07-20 DIAGNOSIS — M542 Cervicalgia: Secondary | ICD-10-CM | POA: Diagnosis present

## 2022-07-20 DIAGNOSIS — Z79899 Other long term (current) drug therapy: Secondary | ICD-10-CM | POA: Diagnosis not present

## 2022-07-20 MED ORDER — PROCHLORPERAZINE EDISYLATE 10 MG/2ML IJ SOLN: 10.0000 mg | Freq: Once | INTRAMUSCULAR | Status: DC

## 2022-07-20 MED ORDER — KETOROLAC TROMETHAMINE 60 MG/2ML IM SOLN
30.0000 mg | Freq: Once | INTRAMUSCULAR | Status: AC
  Administered 2022-07-20: 30 mg via INTRAMUSCULAR
  Filled 2022-07-20: qty 2

## 2022-07-20 MED ORDER — ONDANSETRON 4 MG PO TBDP
4.0000 mg | ORAL_TABLET | Freq: Once | ORAL | Status: AC
  Administered 2022-07-20: 4 mg via ORAL
  Filled 2022-07-20: qty 1

## 2022-07-20 MED ORDER — FENTANYL CITRATE PF 50 MCG/ML IJ SOSY
50.0000 ug | PREFILLED_SYRINGE | Freq: Once | INTRAMUSCULAR | Status: AC
  Administered 2022-07-20: 50 ug via INTRAMUSCULAR
  Filled 2022-07-20: qty 1

## 2022-07-20 MED ORDER — DIAZEPAM 5 MG PO TABS
5.0000 mg | ORAL_TABLET | Freq: Once | ORAL | Status: AC
  Administered 2022-07-20: 5 mg via ORAL
  Filled 2022-07-20: qty 1

## 2022-07-20 NOTE — ED Triage Notes (Signed)
  Patient BIB husband for neck pain that started Monday.  Patient has hx of degenerative disk disease and cervical spondylosis.  Patient states she was doing housework on Monday and thinks she may have overdid it.  Tried to rest yesterday and took prescribed methocarbamol and 1 aleve at 1800 last night.  Patient unable to get comfortable enough to sleep.  Pain 8/10, throbbing pain.

## 2022-07-20 NOTE — ED Provider Notes (Signed)
[facility] EMERGENCY DEPT Provider Note  CSN: [id number] Arrival date & time: 07/20/22 0304  Chief Complaint(s) Neck Pain  HPI Samantha Lowe is a 51 y.o. female with a past medical history including cervical degenerative disc disease and spondylosis who presents to the emergency department with exacerbation of chronic upper back and neck pain.  Patient reports that she was feeling well over the past couple days and decided to do some housecleaning.  Yesterday she began having gradually worsening pain that became severe this evening.  She has tried taking over-the-counter Aleve or muscle relaxers with minimal relief.  She denies any extremity weakness, numbness or paresthesias.  Pain is worse with movement and range of motion of her neck.  No fevers, coughing or sore throat.  Due to the severity of the pain patient feels nauseated.  The history is provided by the patient and the spouse.    Past Medical History Past Medical History:  Diagnosis Date   GERD (gastroesophageal reflux disease)    Heart palpitations    Hyperlipidemia    Hypertension    Patient Active Problem List   Diagnosis Date Noted   Chest pain    FOOT SPRAIN 01/19/2009   CONTUSION, HEAD 01/19/2009   CONTUSION OF HIP 01/19/2009   CONSTIPATION 08/26/2008   NAUSEA 08/26/2008   HEARTBURN 08/26/2008   ABDOMINAL PAIN, RIGHT UPPER QUADRANT 08/26/2008   ABDOMINAL PAIN, EPIGASTRIC 04/21/2008   SEBACEOUS CYST 01/23/2008   HYPERTENSION 06/20/2007   Home Medication(s) Prior to Admission medications   Medication Sig Start Date End Date Taking? Authorizing Provider  atorvastatin (LIPITOR) 20 MG tablet Take 20 mg by mouth daily. 05/12/21   [provider]  cetirizine (ZYRTEC) 10 MG tablet Take 1 tablet by mouth daily at 12 noon.    [provider]  esomeprazole (NEXIUM) 40 MG capsule Take 1 capsule by mouth daily at 12 noon. 09/12/19   [provider]  hydrochlorothiazide  (HYDRODIURIL) 25 MG tablet Take 25 mg by mouth daily. 05/12/21   [provider]  methocarbamol (ROBAXIN) 500 MG tablet Take 500 mg by mouth as needed. 03/29/21   [provider]  naproxen (EC NAPROSYN) 500 MG EC tablet Take 1 tablet by mouth as needed.    [provider]  valsartan (DIOVAN) 160 MG tablet Take 160 mg by mouth daily. 06/28/21   [provider]                                                                                                                                    Allergies Patient has no known allergies.  Review of Systems Review of Systems As noted in HPI  Physical Exam Vital Signs  I have reviewed the triage vital signs BP 137/87 (BP Location: Left Arm)   Pulse 78   Temp 98 F (36.7 C) (Oral)   Resp 18   Ht 5\' 4"  (1.626 m)  Wt 104.3 kg   SpO2 100%   BMI 39.48 kg/m   Physical Exam Vitals reviewed.  Constitutional:      General: She is not in acute distress.    Appearance: She is well-developed. She is not diaphoretic.  HENT:     Head: Normocephalic and atraumatic.     Right Ear: External ear normal.     Left Ear: External ear normal.     Nose: Nose normal.  Eyes:     General: No scleral icterus.    Conjunctiva/sclera: Conjunctivae normal.  Neck:     Trachea: Phonation normal.   Cardiovascular:     Rate and Rhythm: Normal rate and regular rhythm.  Pulmonary:     Effort: Pulmonary effort is normal. No respiratory distress.     Breath sounds: No stridor.  Abdominal:     General: There is no distension.  Musculoskeletal:     Right hand: Normal range of motion. Normal strength. Normal sensation. Normal pulse.     Left hand: Normal range of motion. Normal strength. Normal sensation.     Cervical back: Spasms and torticollis present. Muscular tenderness present. No spinous process tenderness. Decreased range of motion.     Thoracic back: Spasms and tenderness present. No bony tenderness.        Back:  Neurological:     Mental Status: She is alert and oriented to person, place, and time.  Psychiatric:        Behavior: Behavior normal.     ED Results and Treatments Labs (all labs ordered are listed, but only abnormal results are displayed) Labs Reviewed - No data to display                                                                                                                       EKG  EKG Interpretation  Date/Time:    Ventricular Rate:    PR Interval:    QRS Duration:   QT Interval:    QTC Calculation:   R Axis:     Text Interpretation:         Radiology No results found.  Medications Ordered in ED Medications  ketorolac (TORADOL) injection 30 mg (30 mg Intramuscular Given 07/20/22 0451)  fentaNYL (SUBLIMAZE) injection 50 mcg (50 mcg Intramuscular Given 07/20/22 0451)  diazepam (VALIUM) tablet 5 mg (5 mg Oral Given 07/20/22 0451)  ondansetron (ZOFRAN-ODT) disintegrating tablet 4 mg (4 mg Oral Given 07/20/22 0451)  Procedures Procedures  (including critical care time)  Medical Decision Making / ED Course   Medical Decision Making Risk Prescription drug management. Parenteral controlled substances.    Presentation is consistent with muscle strain/spasm of the upper back and neck. Nontraumatic, no need for imaging at this time. Factious symptoms concerning for RPA, PTA.  Patient provided with IM Toradol and fentanyl as well as Valium and ODT Zofran      Final Clinical Impression(s) / ED Diagnoses Final diagnoses:  Torticollis, acute  Muscle spasm of back   The patient appears reasonably screened and/or stabilized for discharge and I doubt any other medical condition or other [facility] requiring further screening, evaluation, or treatment in the ED at this time. I have discussed the findings, Dx and Tx plan with the  patient/family who expressed understanding and agree(s) with the plan. Discharge instructions discussed at length. The patient/family was given strict return precautions who verbalized understanding of the instructions. No further questions at time of discharge.  Disposition: Discharge  Condition: Good  ED Discharge Orders     None        Follow Up: [facility] [address] [place] [address] [phone]  Call  to schedule an appointment for close follow up           This chart was dictated using voice recognition software.  Despite best efforts to proofread,  errors can occur which can change the documentation meaning.    Nira Conn, MD 07/20/22 [phone]

## 2022-07-20 NOTE — Discharge Instructions (Signed)
You may use over-the-counter Motrin (Ibuprofen), Acetaminophen (Tylenol), topical muscle creams such as SalonPas, Icy Hot, Bengay, etc. Please stretch, apply ice or heat (whichever helps), and have massage therapy for additional assistance.  

## 2022-07-20 NOTE — ED Notes (Signed)
Pt verbalizes understanding of discharge instructions. Opportunity for questioning and answers were provided. Pt discharged from ED to home with husband.    

## 2022-08-04 ENCOUNTER — Ambulatory Visit: Payer: Managed Care, Other (non HMO) | Admitting: Cardiology

## 2024-07-16 ENCOUNTER — Ambulatory Visit: Admitting: Rehabilitative and Restorative Service Providers"

## 2024-07-16 NOTE — Therapy (Deleted)
 OUTPATIENT PHYSICAL THERAPY CERVICAL EVALUATION   Patient Name: Samantha Lowe MRN: [id number] DOB:Sep 10, 1971, 53 y.o., female Today's Date: 07/16/2024  END OF SESSION:   Past Medical History:  Diagnosis Date   GERD (gastroesophageal reflux disease)    Heart palpitations    Hyperlipidemia    Hypertension    Past Surgical History:  Procedure Laterality Date   athroscopy right knee     BREAST BIOPSY  2013   Rt. negative.   ganglion cyst right wrist     TUBAL LIGATION     Patient Active Problem List   Diagnosis Date Noted   Chest pain    Sprain of foot 01/19/2009   Contusion of scalp, face, and neck, excluding eyes 01/19/2009   CONTUSION OF HIP 01/19/2009   Constipation 08/26/2008   NAUSEA 08/26/2008   HEARTBURN 08/26/2008   ABDOMINAL PAIN, RIGHT UPPER QUADRANT 08/26/2008   ABDOMINAL PAIN, EPIGASTRIC 04/21/2008   SEBACEOUS CYST 01/23/2008   Essential hypertension 06/20/2007    PCP: [facility]  REFERRING PROVIDER: Camie Pickle   REFERRING DIAG: [phone] (ICD-10-CM) - Other spondylosis with radiculopathy, cervical region   THERAPY DIAG:  No diagnosis found.  Rationale for Evaluation and Treatment: Rehabilitation  ONSET DATE: 07/10/24  SUBJECTIVE:                                                                                                                                                                                                         SUBJECTIVE STATEMENT: *** Hand dominance: {MISC; OT HAND DOMINANCE:[id number]}  PERTINENT HISTORY:  ***  PAIN:  Are you having pain? Yes: NPRS scale: *** Pain location: *** Pain description: *** Aggravating factors: *** Relieving factors: ***  PRECAUTIONS: {Therapy precautions:24002}  WEIGHT BEARING RESTRICTIONS: No  FALLS:  Has patient fallen in last 6 months? {fallsyesno:27318}  LIVING ENVIRONMENT: Lives with: {[facility] lives with:25569::lives with their family} Lives in: {Lives in:25570} Stairs:  {opstairs:27293} Has following equipment at home: {Assistive devices:23999}  OCCUPATION: ***  PLOF: {PLOF:24004}  PATIENT GOALS: ***  NEXT MD VISIT: ***  OBJECTIVE:  Note: Objective measures were completed at Evaluation unless otherwise noted.  DIAGNOSTIC FINDINGS:  ***  PATIENT SURVEYS:  {rehab surveys:24030}  COGNITION: Overall cognitive status: {cognition:24006}  SENSATION: {sensation:27233}  POSTURE: {posture:25561}  PALPATION: ***   CERVICAL ROM:   {AROM/PROM:27142} ROM A/PROM (deg) eval  Flexion   Extension   Right lateral flexion   Left lateral flexion   Right rotation   Left rotation    (Blank rows = not tested)  UPPER EXTREMITY ROM:  {AROM/PROM:27142} ROM Right eval Left  eval  Shoulder flexion    Shoulder extension    Shoulder abduction    Shoulder adduction    Shoulder extension    Shoulder internal rotation    Shoulder external rotation    Elbow flexion    Elbow extension    Wrist flexion    Wrist extension    Wrist ulnar deviation    Wrist radial deviation    Wrist pronation    Wrist supination     (Blank rows = not tested)  UPPER EXTREMITY MMT:  MMT Right eval Left eval  Shoulder flexion    Shoulder extension    Shoulder abduction    Shoulder adduction    Shoulder extension    Shoulder internal rotation    Shoulder external rotation    Middle trapezius    Lower trapezius    Elbow flexion    Elbow extension    Wrist flexion    Wrist extension    Wrist ulnar deviation    Wrist radial deviation    Wrist pronation    Wrist supination    Grip strength     (Blank rows = not tested)  CERVICAL SPECIAL TESTS:  {Cervical special tests:25246}  FUNCTIONAL TESTS:  {Functional tests:24029}  TREATMENT DATE: ***                                                                                                                                 PATIENT EDUCATION:  Education details: *** Person educated: {Person  educated:25204} Education method: {Education Method:25205} Education comprehension: {Education Comprehension:25206}  HOME EXERCISE PROGRAM: ***  ASSESSMENT:  CLINICAL IMPRESSION: Patient is a *** y.o. *** who was seen today for physical therapy evaluation and treatment for ***.   OBJECTIVE IMPAIRMENTS: {opptimpairments:25111}.   ACTIVITY LIMITATIONS: {activitylimitations:27494}  PARTICIPATION LIMITATIONS: {participationrestrictions:25113}  PERSONAL FACTORS: {Personal factors:25162} are also affecting patient's functional outcome.   REHAB POTENTIAL: {rehabpotential:25112}  CLINICAL DECISION MAKING: {clinical decision making:25114}  EVALUATION COMPLEXITY: {Evaluation complexity:25115}   GOALS: Goals reviewed with patient? {yes/no:20286}  SHORT TERM GOALS: Target date: ***  *** Baseline:  Goal status: INITIAL  2.  *** Baseline:  Goal status: INITIAL  3.  *** Baseline:  Goal status: INITIAL  4.  *** Baseline:  Goal status: INITIAL  5.  *** Baseline:  Goal status: INITIAL  6.  *** Baseline:  Goal status: INITIAL  LONG TERM GOALS: Target date: ***  *** Baseline:  Goal status: INITIAL  2.  *** Baseline:  Goal status: INITIAL  3.  *** Baseline:  Goal status: INITIAL  4.  *** Baseline:  Goal status: INITIAL  5.  *** Baseline:  Goal status: INITIAL  6.  *** Baseline:  Goal status: INITIAL   PLAN:  PT FREQUENCY: {rehab frequency:25116}  PT DURATION: {rehab duration:25117}  PLANNED INTERVENTIONS: {rehab planned interventions:25118::97110-Therapeutic exercises,97530- Therapeutic [phone]- Neuromuscular re-education,97535- Self Rjmz,02859- Manual therapy}  PLAN FOR NEXT SESSION: ***   Maelie Chriswell, PT 07/16/2024, 8:11 AM
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
## 2024-08-07 ENCOUNTER — Encounter: Payer: Self-pay | Admitting: Rehabilitative and Restorative Service Providers"

## 2024-08-07 ENCOUNTER — Ambulatory Visit: Attending: Surgery | Admitting: Rehabilitative and Restorative Service Providers"

## 2024-08-07 ENCOUNTER — Other Ambulatory Visit: Payer: Self-pay

## 2024-08-07 DIAGNOSIS — G4486 Cervicogenic headache: Secondary | ICD-10-CM | POA: Insufficient documentation

## 2024-08-07 DIAGNOSIS — R29898 Other symptoms and signs involving the musculoskeletal system: Secondary | ICD-10-CM | POA: Diagnosis present

## 2024-08-07 DIAGNOSIS — M6281 Muscle weakness (generalized): Secondary | ICD-10-CM | POA: Insufficient documentation

## 2024-08-07 DIAGNOSIS — M539 Dorsopathy, unspecified: Secondary | ICD-10-CM | POA: Insufficient documentation

## 2024-08-13 ENCOUNTER — Ambulatory Visit: Admitting: Rehabilitative and Restorative Service Providers"

## 2024-08-13 ENCOUNTER — Telehealth: Payer: Self-pay | Admitting: Rehabilitative and Restorative Service Providers"

## 2024-08-13 NOTE — Telephone Encounter (Signed)
 Samantha Lowe was scheduled for PT appointment today at 1:15 and failed to show for appointment. Left message asking patient to verify future appointments.  Jaron Czarnecki P. Ina PT, MPH 08/13/24 1:38 PM

## 2024-08-20 ENCOUNTER — Ambulatory Visit: Admitting: Rehabilitative and Restorative Service Providers"

## 2024-08-20 ENCOUNTER — Encounter: Payer: Self-pay | Admitting: Rehabilitative and Restorative Service Providers"

## 2024-08-20 DIAGNOSIS — M6281 Muscle weakness (generalized): Secondary | ICD-10-CM

## 2024-08-20 DIAGNOSIS — R29898 Other symptoms and signs involving the musculoskeletal system: Secondary | ICD-10-CM

## 2024-08-20 DIAGNOSIS — M539 Dorsopathy, unspecified: Secondary | ICD-10-CM

## 2024-08-20 DIAGNOSIS — G4486 Cervicogenic headache: Secondary | ICD-10-CM
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
## 2024-08-22 ENCOUNTER — Encounter: Payer: Self-pay | Admitting: Rehabilitative and Restorative Service Providers"

## 2024-08-22 ENCOUNTER — Ambulatory Visit: Admitting: Rehabilitative and Restorative Service Providers"

## 2024-08-22 DIAGNOSIS — M539 Dorsopathy, unspecified: Secondary | ICD-10-CM | POA: Diagnosis not present

## 2024-08-22 DIAGNOSIS — G4486 Cervicogenic headache: Secondary | ICD-10-CM

## 2024-08-22 DIAGNOSIS — M6281 Muscle weakness (generalized): Secondary | ICD-10-CM

## 2024-08-22 DIAGNOSIS — R29898 Other symptoms and signs involving the musculoskeletal system: Secondary | ICD-10-CM
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
## 2024-08-27 ENCOUNTER — Encounter

## 2024-08-30 ENCOUNTER — Ambulatory Visit: Admitting: Physical Therapy

## 2024-08-30 ENCOUNTER — Encounter: Payer: Self-pay | Admitting: Physical Therapy

## 2024-08-30 DIAGNOSIS — R29898 Other symptoms and signs involving the musculoskeletal system: Secondary | ICD-10-CM

## 2024-08-30 DIAGNOSIS — M6281 Muscle weakness (generalized): Secondary | ICD-10-CM

## 2024-08-30 DIAGNOSIS — G4486 Cervicogenic headache: Secondary | ICD-10-CM

## 2024-08-30 DIAGNOSIS — M539 Dorsopathy, unspecified: Secondary | ICD-10-CM
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
## 2024-09-03 ENCOUNTER — Encounter: Payer: Self-pay | Admitting: Rehabilitative and Restorative Service Providers"

## 2024-09-03 ENCOUNTER — Ambulatory Visit: Admitting: Rehabilitative and Restorative Service Providers"

## 2024-09-03 DIAGNOSIS — G4486 Cervicogenic headache: Secondary | ICD-10-CM

## 2024-09-03 DIAGNOSIS — M6281 Muscle weakness (generalized): Secondary | ICD-10-CM

## 2024-09-03 DIAGNOSIS — M539 Dorsopathy, unspecified: Secondary | ICD-10-CM

## 2024-09-03 DIAGNOSIS — R29898 Other symptoms and signs involving the musculoskeletal system: Secondary | ICD-10-CM
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
## 2024-09-05 ENCOUNTER — Ambulatory Visit: Admitting: Rehabilitative and Restorative Service Providers"

## 2025-01-09 ENCOUNTER — Other Ambulatory Visit: Payer: Self-pay | Admitting: Family Medicine

## 2025-01-09 DIAGNOSIS — Z79899 Other long term (current) drug therapy: Secondary | ICD-10-CM
